# Patient Record
Sex: Female | Born: 1945 | Race: White | Hispanic: No | Smoking: Never smoker
Health system: Southern US, Community
[De-identification: ages and names within clinical notes are randomized; demographics above are authoritative.]

## PROBLEM LIST (undated history)

## (undated) DIAGNOSIS — F329 Major depressive disorder, single episode, unspecified: Secondary | ICD-10-CM

## (undated) DIAGNOSIS — D693 Immune thrombocytopenic purpura: Secondary | ICD-10-CM

## (undated) DIAGNOSIS — F32A Depression, unspecified: Secondary | ICD-10-CM

## (undated) DIAGNOSIS — E079 Disorder of thyroid, unspecified: Secondary | ICD-10-CM

## (undated) DIAGNOSIS — I10 Essential (primary) hypertension: Secondary | ICD-10-CM

## (undated) DIAGNOSIS — J45909 Unspecified asthma, uncomplicated: Secondary | ICD-10-CM

## (undated) HISTORY — PX: ABDOMINAL HYSTERECTOMY: SHX81

## (undated) HISTORY — PX: THYROIDECTOMY: SHX17

---

## 2018-03-05 ENCOUNTER — Other Ambulatory Visit: Payer: Self-pay

## 2018-03-05 ENCOUNTER — Encounter: Payer: Self-pay | Admitting: Emergency Medicine

## 2018-03-05 ENCOUNTER — Ambulatory Visit
Admission: EM | Admit: 2018-03-05 | Discharge: 2018-03-05 | Disposition: A | Discharge status: Home / Self Care | Payer: 59 | Attending: Family Medicine | Admitting: Family Medicine

## 2018-03-05 ENCOUNTER — Ambulatory Visit (INDEPENDENT_AMBULATORY_CARE_PROVIDER_SITE_OTHER): Payer: 59

## 2018-03-05 DIAGNOSIS — B370 Candidal stomatitis: Secondary | ICD-10-CM | POA: Diagnosis not present

## 2018-03-05 DIAGNOSIS — J181 Lobar pneumonia, unspecified organism: Secondary | ICD-10-CM | POA: Insufficient documentation

## 2018-03-05 DIAGNOSIS — J189 Pneumonia, unspecified organism: Secondary | ICD-10-CM

## 2018-03-05 HISTORY — DX: Unspecified asthma, uncomplicated: J45.909

## 2018-03-05 HISTORY — DX: Depression, unspecified: F32.A

## 2018-03-05 HISTORY — DX: Essential (primary) hypertension: I10

## 2018-03-05 HISTORY — DX: Major depressive disorder, single episode, unspecified: F32.9

## 2018-03-05 HISTORY — DX: Disorder of thyroid, unspecified: E07.9

## 2018-03-05 HISTORY — DX: Immune thrombocytopenic purpura: D69.3

## 2018-03-05 MED ORDER — AZITHROMYCIN 250 MG PO TABS: ORAL_TABLET | ORAL | 0 refills | Status: AC

## 2018-03-05 MED ORDER — BENZONATATE 100 MG PO CAPS: 100.0000 mg | ORAL_CAPSULE | Freq: Three times a day (TID) | ORAL | 0 refills | Status: AC | PRN

## 2018-03-05 MED ORDER — AMOXICILLIN-POT CLAVULANATE ER 1000-62.5 MG PO TB12: 2.0000 | ORAL_TABLET | Freq: Two times a day (BID) | ORAL | 0 refills | Status: AC

## 2018-03-05 MED ORDER — IPRATROPIUM-ALBUTEROL 0.5-2.5 (3) MG/3ML IN SOLN
3.0000 mL | Freq: Four times a day (QID) | RESPIRATORY_TRACT | Status: DC
  Administered 2018-03-05: 3 mL via RESPIRATORY_TRACT

## 2018-03-05 MED ORDER — NYSTATIN 100000 UNIT/ML MT SUSP: 500000.0000 [IU] | Freq: Four times a day (QID) | OROMUCOSAL | 0 refills | Status: AC

## 2018-03-05 NOTE — ED Triage Notes (Signed)
Patient stated she was seen in the ER on 12/28 and diagnosed with the flu. She was told to return to the ER if her symptoms did not improve. Patient states she had a fever last night of 100.2. She is still coughing.

## 2018-03-05 NOTE — ED Provider Notes (Signed)
[facility]    CSN: [id number] Arrival date & time: 03/05/18  1240  History   Chief Complaint Chief Complaint  Patient presents with  . Cough   HPI  73 year old female presents with complaints of cough, congestion, and fever.  Patient recently diagnosed and treated for influenza on 12/28.  Patient states that she continues to not feel well.  She reports cough which is productive.  Reports that she is congested and is also wheezing.  Patient states that she had a temperature last night of 100.2.  Patient is concerned that she may have underlying pneumonia.  Additionally, patient states that her mouth and throat have been hurting.  No known exacerbating factors other than her recent illness.  No other associated symptoms.  No other complaints.  History reviewed and updated as below.  Past Medical History:  Diagnosis Date  . Acute idiopathic thrombocytopenic purpura (HCC)   . Asthma   . Depression   . Hypertension   . Thyroid disease     Past Surgical History:  Procedure Laterality Date  . ABDOMINAL HYSTERECTOMY    . THYROIDECTOMY      OB History   No obstetric history on file.      Home Medications    Prior to Admission medications   Medication Sig Start Date End Date Taking? Authorizing Provider  albuterol (PROAIR HFA) 108 (90 Base) MCG/ACT inhaler Inhale into the lungs every 6 (six) hours as needed for wheezing or shortness of breath.   Yes [provider]  buPROPion (WELLBUTRIN XL) 150 MG 24 hr tablet Take 150 mg by mouth daily.   Yes [provider]  fluticasone (FLOVENT HFA) 110 MCG/ACT inhaler Inhale into the lungs 2 (two) times daily.   Yes [provider]  ibuprofen (ADVIL,MOTRIN) 200 MG tablet Take 400 mg by mouth every 6 (six) hours as needed.   Yes [provider]  Levothyroxine Sodium (SYNTHROID PO) Take 150 mcg by mouth.   Yes [provider]  lisinopril (PRINIVIL,ZESTRIL) 5 MG tablet Take 5 mg by  mouth daily.   Yes [provider]  amoxicillin-clavulanate (AUGMENTIN XR) 1000-62.5 MG 12 hr tablet Take 2 tablets by mouth 2 (two) times daily for 7 days. 03/05/18 03/12/18  Tommie Sams, DO  azithromycin (ZITHROMAX) 250 MG tablet 2 tablets on day 1, then 1 tablet daily on days 2-5. 03/05/18   Tommie Sams, DO  benzonatate (TESSALON) 100 MG capsule Take 1 capsule (100 mg total) by mouth 3 (three) times daily as needed. 03/05/18   Tommie Sams, DO  nystatin (MYCOSTATIN) 100000 UNIT/ML suspension Take 5 mLs (500,000 Units total) by mouth 4 (four) times daily for 7 days. For 1 week. 03/05/18 03/12/18  Tommie Sams, DO    Social History Social History   Tobacco Use  . Smoking status: Never Smoker  . Smokeless tobacco: Never Used  Substance Use Topics  . Alcohol use: Never    Frequency: Never  . Drug use: Never     Allergies   Hepatitis b virus vaccine   Review of Systems Review of Systems  Constitutional: Positive for fever.  Respiratory: Positive for cough and wheezing.    Physical Exam Triage Vital Signs ED Triage Vitals  Enc Vitals Group     BP 03/05/18 1303 123/71     Pulse Rate 03/05/18 1303 80     Resp 03/05/18 1303 18     Temp 03/05/18 1303 98.6 F (37 C)  Temp Source 03/05/18 1303 Oral     SpO2 03/05/18 1303 98 %     Weight 03/05/18 1256 148 lb (67.1 kg)     Height 03/05/18 1256 5' (1.524 m)     Head Circumference --      Peak Flow --      Pain Score 03/05/18 1255 5     Pain Loc --      Pain Edu? --      Excl. in GC? --    Updated Vital Signs BP 123/71   Pulse 80   Temp 98.6 F (37 C) (Oral)   Resp 18   Ht 5' (1.524 m)   Wt 67.1 kg   SpO2 98%   BMI 28.90 kg/m   Visual Acuity Right Eye Distance:   Left Eye Distance:   Bilateral Distance:    Right Eye Near:   Left Eye Near:    Bilateral Near:     Physical Exam Vitals signs and nursing note reviewed.  Constitutional:      General: She is not in acute distress. HENT:     Head:  Normocephalic and atraumatic.     Mouth/Throat:     Comments: Oropharynx with some exudate of the soft palate.  Consistent with thrush. Eyes:     General:        Right eye: No discharge.        Left eye: No discharge.     Conjunctiva/sclera: Conjunctivae normal.  Cardiovascular:     Rate and Rhythm: Normal rate and regular rhythm.  Pulmonary:     Effort: Pulmonary effort is normal.     Comments: Diffuse coarse breath sounds with expiratory wheezing. Neurological:     Mental Status: She is alert.  Psychiatric:        Mood and Affect: Mood normal.        Behavior: Behavior normal.    UC Treatments / Results  Labs (all labs ordered are listed, but only abnormal results are displayed) Labs Reviewed - No data to display  EKG None  Radiology Dg Chest 2 View  Result Date: 03/05/2018 CLINICAL DATA:  Productive cough with fever and thrush. Patient had flu 2 weeks ago. History of pneumonia/bronchitis and asthma. EXAM: CHEST - 2 VIEW COMPARISON:  None. FINDINGS: The heart size and mediastinal contours are normal. There is probable mild aortic atherosclerosis. There is mild diffuse central airway thickening with patchy airspace opacity anteriorly in the left lower lobe, best seen on the lateral view. There is no airspace disease on the right, edema, pleural effusion or pneumothorax. Mild degenerative changes in the spine. IMPRESSION: Patchy left lower lobe airspace disease consistent with mild pneumonia. Followup PA and lateral chest X-ray is recommended in 3-4 weeks following trial of antibiotic therapy to ensure resolution and exclude underlying malignancy. Electronically Signed   By: Carey BullocksWilliam  Veazey M.D.   On: 03/05/2018 13:50    Procedures Procedures (including critical care time)  Medications Ordered in UC Medications  ipratropium-albuterol (DUONEB) 0.5-2.5 (3) MG/3ML nebulizer solution 3 mL (3 mLs Nebulization Given 03/05/18 1322)    Initial Impression / Assessment and Plan / UC  Course  I have reviewed the triage vital signs and the nursing notes.  Pertinent labs & imaging results that were available during my care of the patient were reviewed by me and considered in my medical decision making (see chart for details).    [age] year old female with recent complains of presents with community-acquired pneumonia of the left lower  lobe.  Also found to have thrush.  Treating with Augmentin and azithromycin.  Tessalon Perles for cough.  Nystatin swish and swallow for thrush. Final Clinical Impressions(s) / UC Diagnoses   Final diagnoses:  Pneumonia of left lower lobe due to infectious organism [facility])  Thrush   Discharge Instructions   None    ED Prescriptions    Medication Sig Dispense Auth. Provider   amoxicillin-clavulanate (AUGMENTIN XR) 1000-62.5 MG 12 hr tablet Take 2 tablets by mouth 2 (two) times daily for 7 days. 28 tablet Jayveion Stalling G, DO   azithromycin (ZITHROMAX) 250 MG tablet 2 tablets on day 1, then 1 tablet daily on days 2-5. 6 tablet Drayson Dorko G, DO   benzonatate (TESSALON) 100 MG capsule Take 1 capsule (100 mg total) by mouth 3 (three) times daily as needed. 30 capsule Melenie Minniear G, DO   nystatin (MYCOSTATIN) 100000 UNIT/ML suspension Take 5 mLs (500,000 Units total) by mouth 4 (four) times daily for 7 days. For 1 week. 140 mL Tommie Sams, DO     Controlled Substance Prescriptions [place] Controlled Substance Registry consulted? Not Applicable   Tommie Sams, DO 03/05/18 1413

## 2019-11-20 ENCOUNTER — Other Ambulatory Visit: Payer: Self-pay | Admitting: Ophthalmology

## 2019-11-20 DIAGNOSIS — H4922 Sixth [abducent] nerve palsy, left eye: Secondary | ICD-10-CM

## 2019-12-08 ENCOUNTER — Ambulatory Visit: Payer: Medicare Other

## 2021-03-10 ENCOUNTER — Emergency Department
Admission: EM | Admit: 2021-03-10 | Discharge: 2021-03-10 | Disposition: A | Discharge status: Home / Self Care | Payer: Medicare Other | Attending: Emergency Medicine | Admitting: Emergency Medicine

## 2021-03-10 ENCOUNTER — Emergency Department: Payer: Medicare Other

## 2021-03-10 ENCOUNTER — Other Ambulatory Visit: Payer: Self-pay

## 2021-03-10 ENCOUNTER — Encounter: Payer: Self-pay | Admitting: Emergency Medicine

## 2021-03-10 DIAGNOSIS — I10 Essential (primary) hypertension: Secondary | ICD-10-CM | POA: Diagnosis not present

## 2021-03-10 DIAGNOSIS — M25462 Effusion, left knee: Secondary | ICD-10-CM | POA: Insufficient documentation

## 2021-03-10 DIAGNOSIS — M7989 Other specified soft tissue disorders: Secondary | ICD-10-CM | POA: Diagnosis not present

## 2021-03-10 DIAGNOSIS — M25562 Pain in left knee: Secondary | ICD-10-CM | POA: Diagnosis present

## 2021-03-10 LAB — CBC WITH DIFFERENTIAL/PLATELET
Abs Immature Granulocytes: 0.01 10*3/uL (ref 0.00–0.07)
Basophils Absolute: 0.1 10*3/uL (ref 0.0–0.1)
Basophils Relative: 1 %
Eosinophils Absolute: 0.2 10*3/uL (ref 0.0–0.5)
Eosinophils Relative: 3 %
HCT: 39.2 % (ref 36.0–46.0)
Hemoglobin: 12.8 g/dL (ref 12.0–15.0)
Immature Granulocytes: 0 %
Lymphocytes Relative: 21 %
Lymphs Abs: 1.4 10*3/uL (ref 0.7–4.0)
MCH: 27.4 pg (ref 26.0–34.0)
MCHC: 32.7 g/dL (ref 30.0–36.0)
MCV: 83.9 fL (ref 80.0–100.0)
Monocytes Absolute: 0.6 10*3/uL (ref 0.1–1.0)
Monocytes Relative: 9 %
Neutro Abs: 4.3 10*3/uL (ref 1.7–7.7)
Neutrophils Relative %: 66 %
Platelets: 197 10*3/uL (ref 150–400)
RBC: 4.67 MIL/uL (ref 3.87–5.11)
RDW: 13.1 % (ref 11.5–15.5)
WBC: 6.5 10*3/uL (ref 4.0–10.5)
nRBC: 0 % (ref 0.0–0.2)

## 2021-03-10 LAB — BASIC METABOLIC PANEL
Anion gap: 9 (ref 5–15)
BUN: 13 mg/dL (ref 8–23)
CO2: 27 mmol/L (ref 22–32)
Calcium: 9 mg/dL (ref 8.9–10.3)
Chloride: 101 mmol/L (ref 98–111)
Creatinine, Ser: 0.8 mg/dL (ref 0.44–1.00)
GFR, Estimated: >60 mL/min (ref >60)
Glucose, Bld: 126 mg/dL — ABNORMAL HIGH (ref 70–99)
Potassium: 3 mmol/L — ABNORMAL LOW (ref 3.5–5.1)
Sodium: 137 mmol/L (ref 135–145)

## 2021-03-10 LAB — SYNOVIAL CELL COUNT + DIFF, W/ CRYSTALS
Crystals, Fluid: NONE SEEN
Lymphocytes-Synovial Fld: 15 %
Monocyte-Macrophage-Synovial Fluid: 15 %
Neutrophil, Synovial: 70 %
WBC, Synovial: 253 /mm3 — ABNORMAL HIGH (ref 0–200)

## 2021-03-10 MED ORDER — PREDNISONE 20 MG PO TABS
60.0000 mg | ORAL_TABLET | Freq: Once | ORAL | Status: AC
  Administered 2021-03-10: 60 mg via ORAL
  Filled 2021-03-10: qty 3

## 2021-03-10 MED ORDER — LIDOCAINE HCL (PF) 1 % IJ SOLN
5.0000 mL | Freq: Once | INTRAMUSCULAR | Status: AC
  Administered 2021-03-10: 5 mL
  Filled 2021-03-10: qty 5

## 2021-03-10 MED ORDER — PREDNISONE 20 MG PO TABS: 40.0000 mg | ORAL_TABLET | Freq: Every day | ORAL | 0 refills | Status: AC

## 2021-03-10 NOTE — ED Triage Notes (Signed)
C/O left knee pain and swelling x 1 week.   Took a fluid pill.  States initially left knee swelling only, today entire left leg swelling.

## 2021-03-10 NOTE — ED Provider Notes (Signed)
[facility] Provider Note    Event Date/Time   First MD Initiated Contact with Patient 03/10/21 2136     (approximate)  History   Chief Complaint: Left leg pain  HPI  Norma Williams is a 76 y.o. female with a past medical history of hypertension presents to the emergency department for left leg pain.  According to the patient for the past week or so she has been experiencing significant pain in and around the left knee that has gotten worse and she states that spread somewhat down the left calf.  Patient denies any fever.  States she feels her knee is swollen.  Denies any history of gout.  No history of DVT.  No chest pain or shortness of breath.  Physical Exam   Triage Vital Signs: ED Triage Vitals  Enc Vitals Group     BP 03/10/21 1407 (!) 168/95     Pulse Rate 03/10/21 1407 76     Resp 03/10/21 1407 16     Temp 03/10/21 1407 97.8 F (36.6 C)     Temp Source 03/10/21 1407 Oral     SpO2 03/10/21 1407 94 %     Weight 03/10/21 1405 147 lb 14.9 oz (67.1 kg)     Height 03/10/21 1405 5' (1.524 m)     Head Circumference --      Peak Flow --      Pain Score 03/10/21 1404 9     Pain Loc --      Pain Edu? --      Excl. in GC? --     Most recent vital signs: Vitals:   03/10/21 1407 03/10/21 2200  BP: (!) 168/95 103/80  Pulse: 76 65  Resp: 16 19  Temp: 97.8 F (36.6 C) (!) 97.4 F (36.3 C)  SpO2: 94% 98%    General: Awake, no distress.  CV:  Good peripheral perfusion.  Regular rate and rhythm  Resp:  Normal effort.  Equal breath sounds bilaterally.  Abd:  No distention.  Soft, nontender.  No rebound or guarding. Other:  Moderate right knee effusion noted.  Moderate tenderness to palpation.   ED Results / Procedures / Treatments    RADIOLOGY  Ultrasound negative for DVT of the left lower extremity   MEDICATIONS ORDERED IN ED: Medications  lidocaine (PF) (XYLOCAINE) 1 % injection 5 mL (5 mLs Infiltration Given 03/10/21 2203)      IMPRESSION / MDM / ASSESSMENT AND PLAN / ED COURSE  I reviewed the triage vital signs and the nursing notes.  Patient presents emergency department for left knee and calf pain.  On examination patient appears to have a moderate size left knee effusion with tenderness to palpation and tenderness with range of motion.  Neuro vastly intact distally.  No overlying skin color changes no erythema.  No history of gout or DVT.  Ultrasound is negative for DVT.  Given the effusion and pain worsening x1 week I numbed the knee and performed a joint aspiration.  Removed approximately 20 cc of serosanguineous appearing joint fluid.  Patient is afebrile.  She states she has been here over 8 hours and wishes to go home before results are known.  Highly suspect inflammatory effusion due to arthritis versus gout.  We will send the patient home on a prednisone prescription and have the patient follow-up with her doctor on Monday.  Patient agreeable to plan of care.  I discussed with the patient the possibility although very low of  a septic joint she still wishes to leave before results are known.  FINAL CLINICAL IMPRESSION(S) / ED DIAGNOSES   Left knee pain Left knee effusion  Rx / DC Orders   Prednisone   Note:  This document was prepared using Dragon voice recognition software and may include unintentional dictation errors.   Minna Antis, MD 03/10/21 2240

## 2021-03-10 NOTE — ED Provider Triage Note (Signed)
Emergency Medicine Provider Triage Evaluation Note  Norma Williams , a 76 y.o. female  was evaluated in triage.  Pt complains of left leg pain and left knee swelling that has been worsening over the past week. Pain increases with ambulation and flexing left foot. No relief with OTC medication and massage.  Review of Systems  Positive: Left lower extremity pain Negative: Fever, injury  Physical Exam  Ht 5' (1.524 m)    Wt 67.1 kg    BMI 28.89 kg/m  Gen:   Awake, no distress   Resp:  Normal effort  MSK:   Moves extremities without difficulty  Other:    Medical Decision Making  Medically screening exam initiated at 2:08 PM.  Appropriate orders placed.  Norma Williams was informed that the remainder of the evaluation will be completed by another provider, this initial triage assessment does not replace that evaluation, and the importance of remaining in the ED until their evaluation is complete.    Norma Pester, FNP 03/10/21 1411

## 2021-03-10 NOTE — Discharge Instructions (Signed)
Please take your steroids as prescribed for the next 5 days.  If you continue to have knee pain or have worsening knee pain please call the number provided for orthopedics to arrange a follow-up appointment for further evaluation.  Otherwise please follow-up with your PCP/primary care doctor on Monday for recheck/reevaluation.  If you have any fever or worsening pain return to the emergency department immediately.

## 2021-03-14 LAB — BODY FLUID CULTURE W GRAM STAIN
Culture: NO GROWTH
Gram Stain: NONE SEEN

## 2022-03-21 ENCOUNTER — Ambulatory Visit
Admission: RE | Admit: 2022-03-21 | Discharge: 2022-03-21 | Disposition: A | Discharge status: Home / Self Care | Payer: 59 | Attending: Nurse Practitioner | Admitting: Nurse Practitioner

## 2022-03-21 ENCOUNTER — Other Ambulatory Visit: Payer: Self-pay | Admitting: Nurse Practitioner

## 2022-03-21 ENCOUNTER — Ambulatory Visit
Admission: RE | Admit: 2022-03-21 | Discharge: 2022-03-21 | Disposition: A | Discharge status: Home / Self Care | Payer: 59 | Source: Ambulatory Visit | Attending: Nurse Practitioner | Admitting: Nurse Practitioner

## 2022-03-21 DIAGNOSIS — M79641 Pain in right hand: Secondary | ICD-10-CM

## 2022-03-21 DIAGNOSIS — M79642 Pain in left hand: Secondary | ICD-10-CM | POA: Diagnosis present

## 2022-07-20 IMAGING — US US EXTREM LOW VENOUS*L*
1 series · 14 of 24 positions shown · non-contrast
Comparison: None

CLINICAL DATA: LEFT lower extremity pain and swelling for 1 week

EXAM:
LEFT LOWER EXTREMITY VENOUS DOPPLER ULTRASOUND
TECHNIQUE: Gray-scale sonography with compression, as well as color and duplex
ultrasound, were performed to evaluate the deep venous system(s)
from the level of the common femoral vein through the popliteal and
proximal calf veins.

[Series 1: us venous img lower uni left (dvt) · portal-venous · 14 of 32 slices shown]
[im 1/32]
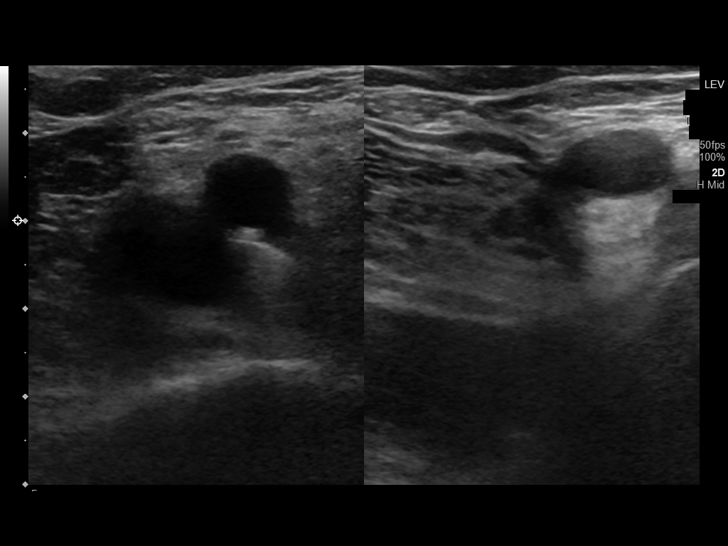
[im 3/32]
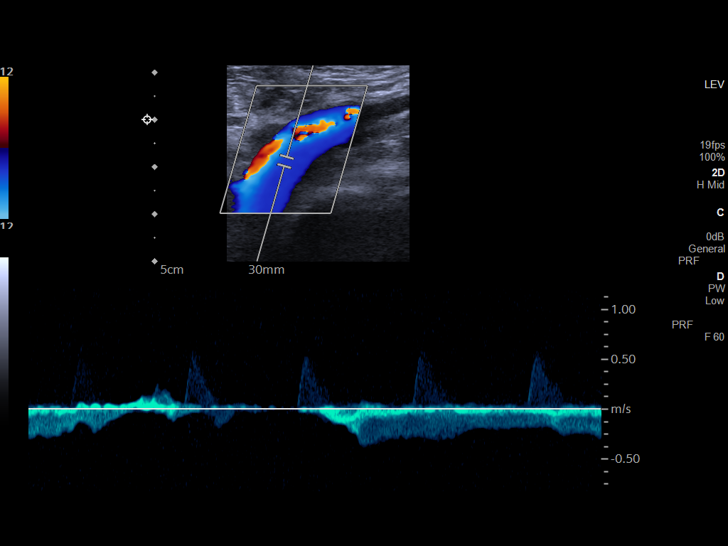
[im 6/32]
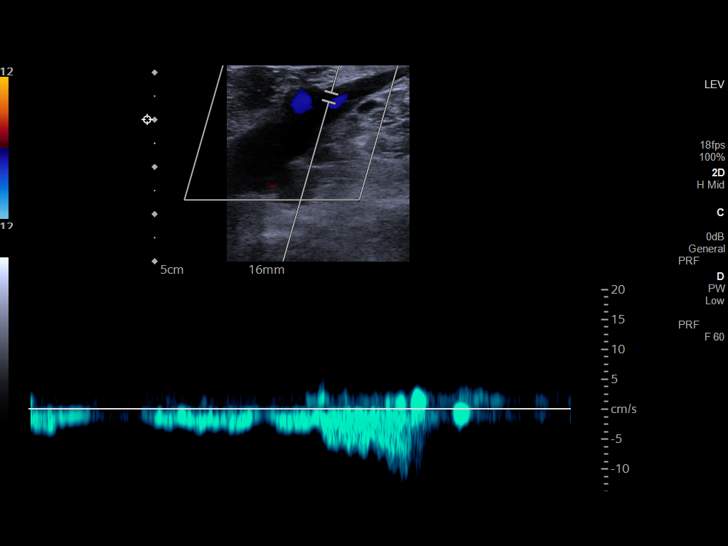
[im 9/32]
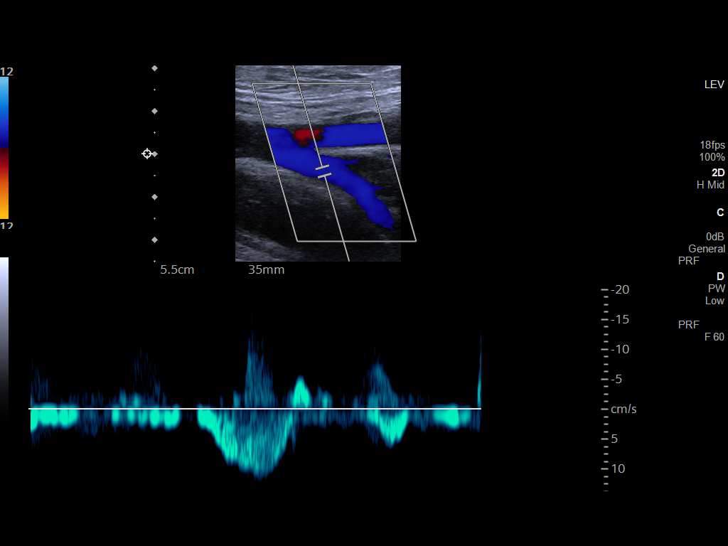
[im 10/32]
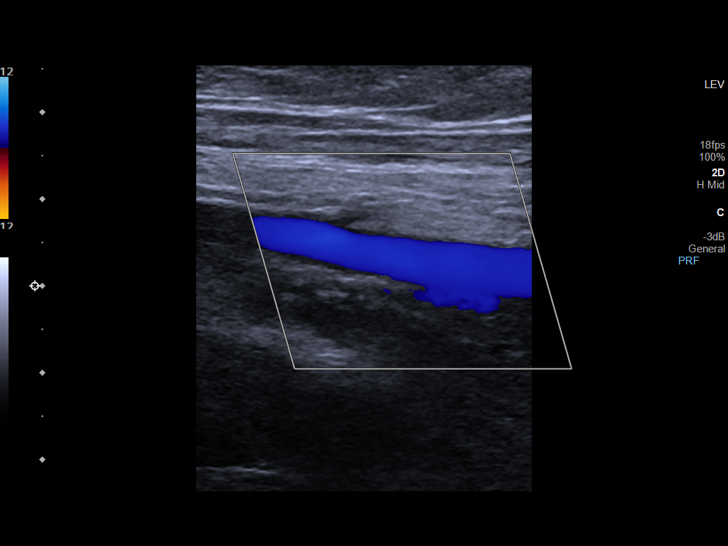
[im 13/32]
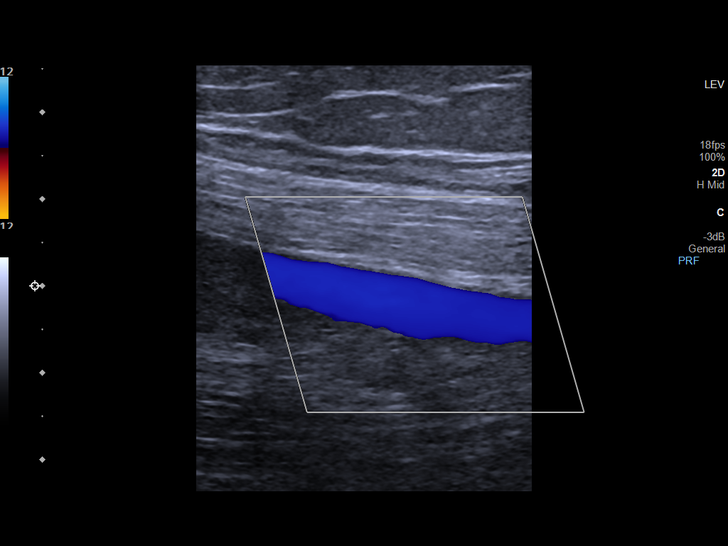
[im 15/32]
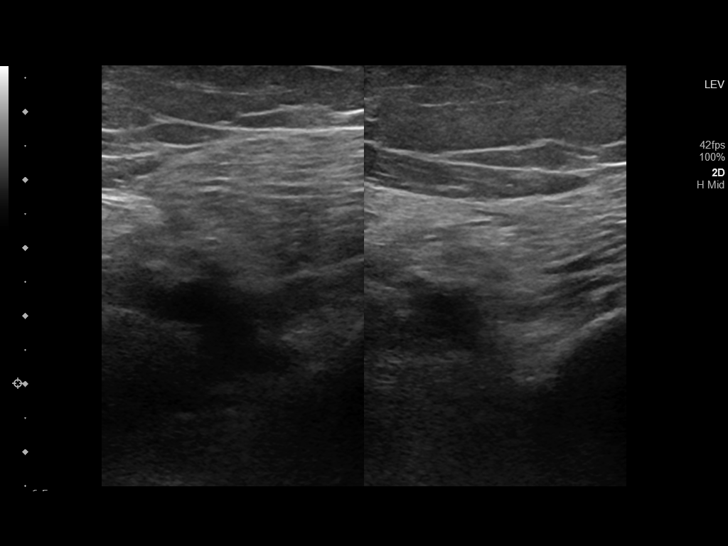
[im 17/32]
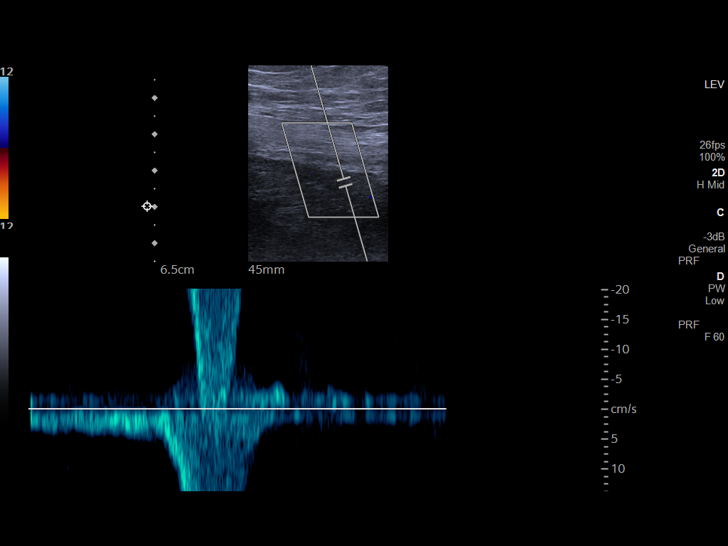
[im 19/32]
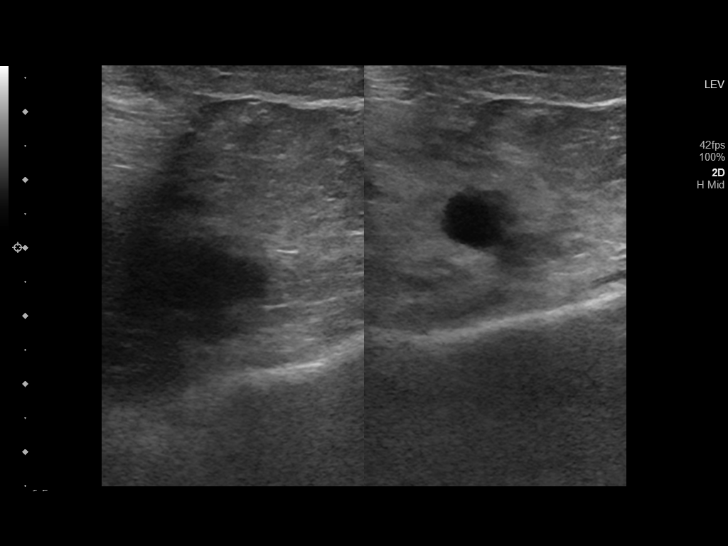
[im 22/32]
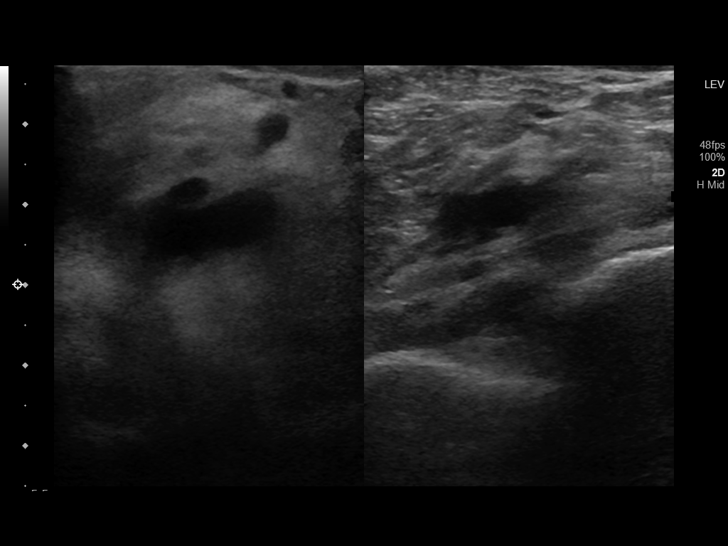
[im 25/32]
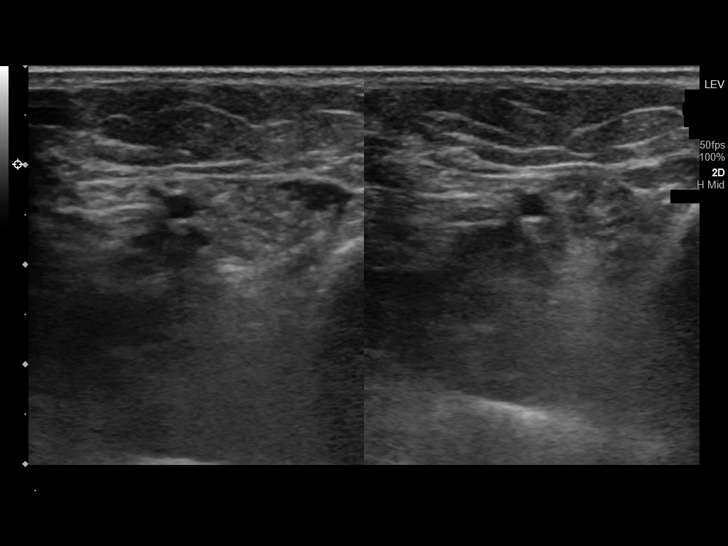
[im 26/32]
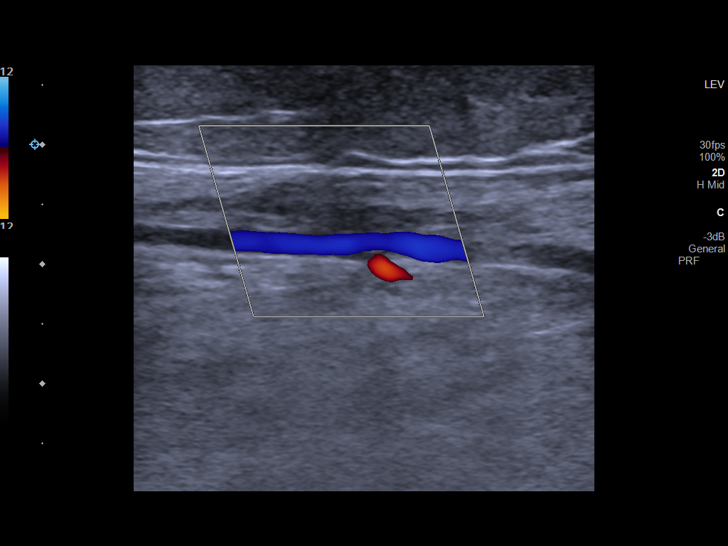
[im 29/32]
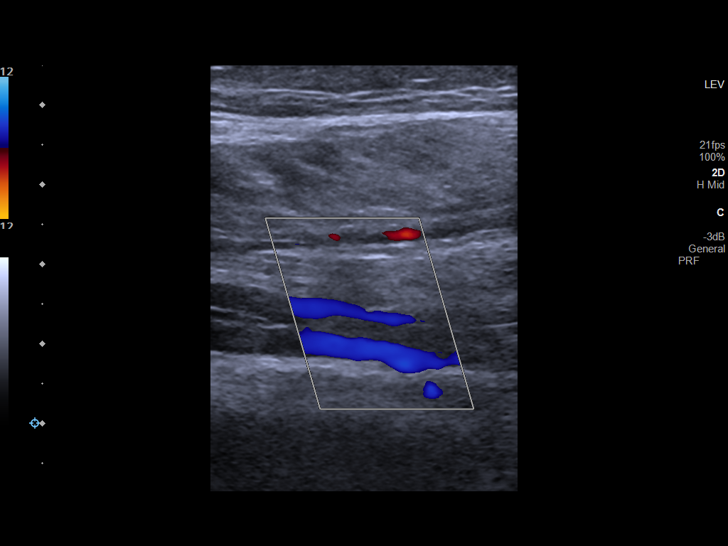
[im 32/32]
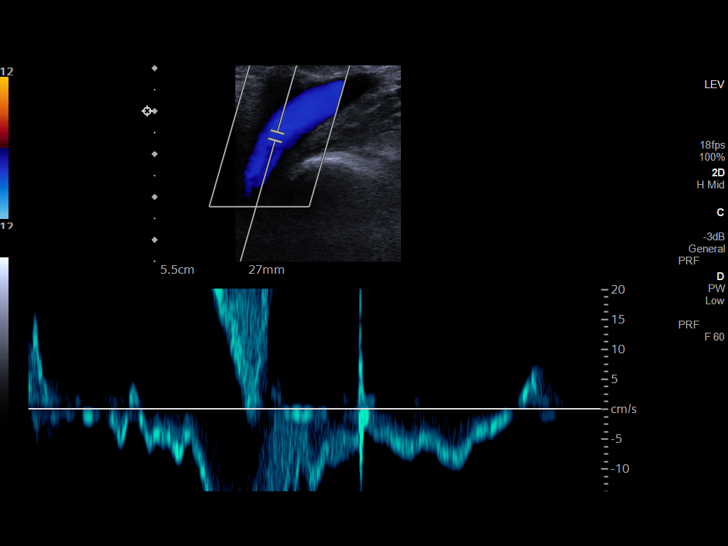

[14 of 24 positions shown; findings below may reference images not displayed]

FINDINGS: VENOUS

Normal compressibility of the common femoral, superficial femoral,
and popliteal veins, as well as the visualized calf veins.
Visualized portions of profunda femoral vein and great saphenous
vein unremarkable. No filling defects to suggest DVT on grayscale or
color Doppler imaging. Doppler waveforms show normal direction of
venous flow, normal respiratory plasticity and response to
augmentation.

Limited views of the contralateral common femoral vein are
unremarkable.

OTHER

None.

Limitations: none
IMPRESSION: No evidence of deep venous thrombosis in the LEFT lower extremity.
